# Patient Record
Sex: Male | Born: 1990 | Race: Black or African American | Hispanic: No | Marital: Single | Smoking: Current some day smoker
Health system: Southern US, Community
[De-identification: ages and names within clinical notes are randomized; demographics above are authoritative.]

## PROBLEM LIST (undated history)

## (undated) DIAGNOSIS — T7840XA Allergy, unspecified, initial encounter: Secondary | ICD-10-CM

## (undated) DIAGNOSIS — R809 Proteinuria, unspecified: Secondary | ICD-10-CM

## (undated) HISTORY — PX: NO PAST SURGERIES: SHX2092

## (undated) HISTORY — DX: Allergy, unspecified, initial encounter: T78.40XA

## (undated) HISTORY — DX: Proteinuria, unspecified: R80.9

---
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
## 2013-09-02 ENCOUNTER — Encounter (HOSPITAL_COMMUNITY): Payer: Self-pay | Admitting: Emergency Medicine

## 2013-09-02 ENCOUNTER — Emergency Department (HOSPITAL_COMMUNITY)
Admission: EM | Admit: 2013-09-02 | Discharge: 2013-09-02 | Disposition: A | Discharge status: Home / Self Care | Payer: BC Managed Care – PPO | Attending: Emergency Medicine | Admitting: Emergency Medicine

## 2013-09-02 ENCOUNTER — Emergency Department (HOSPITAL_COMMUNITY): Payer: BC Managed Care – PPO

## 2013-09-02 DIAGNOSIS — S199XXA Unspecified injury of neck, initial encounter: Secondary | ICD-10-CM

## 2013-09-02 DIAGNOSIS — S6990XA Unspecified injury of unspecified wrist, hand and finger(s), initial encounter: Secondary | ICD-10-CM | POA: Insufficient documentation

## 2013-09-02 DIAGNOSIS — S298XXA Other specified injuries of thorax, initial encounter: Secondary | ICD-10-CM | POA: Insufficient documentation

## 2013-09-02 DIAGNOSIS — S46909A Unspecified injury of unspecified muscle, fascia and tendon at shoulder and upper arm level, unspecified arm, initial encounter: Secondary | ICD-10-CM | POA: Insufficient documentation

## 2013-09-02 DIAGNOSIS — Y92410 Unspecified street and highway as the place of occurrence of the external cause: Secondary | ICD-10-CM | POA: Insufficient documentation

## 2013-09-02 DIAGNOSIS — Y9389 Activity, other specified: Secondary | ICD-10-CM | POA: Insufficient documentation

## 2013-09-02 DIAGNOSIS — S0993XA Unspecified injury of face, initial encounter: Secondary | ICD-10-CM | POA: Insufficient documentation

## 2013-09-02 DIAGNOSIS — S4980XA Other specified injuries of shoulder and upper arm, unspecified arm, initial encounter: Secondary | ICD-10-CM | POA: Insufficient documentation

## 2013-09-02 DIAGNOSIS — S59909A Unspecified injury of unspecified elbow, initial encounter: Secondary | ICD-10-CM | POA: Insufficient documentation

## 2013-09-02 DIAGNOSIS — S59919A Unspecified injury of unspecified forearm, initial encounter: Secondary | ICD-10-CM
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
## 2013-09-02 NOTE — ED Notes (Signed)
Pt received discharge instructions, verbalized understanding and has no further questions. Pt ambulated to exit in stable condition accompanied by parents.  Advised to return to emergency department with new or worsening symptoms.
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
## 2014-09-05 ENCOUNTER — Emergency Department (HOSPITAL_COMMUNITY)
Admission: EM | Admit: 2014-09-05 | Discharge: 2014-09-07 | Disposition: A | Discharge status: Other Acute Inpatient Hospital | Payer: BLUE CROSS/BLUE SHIELD | Attending: Emergency Medicine | Admitting: Emergency Medicine

## 2014-09-05 ENCOUNTER — Emergency Department (HOSPITAL_COMMUNITY): Payer: BLUE CROSS/BLUE SHIELD

## 2014-09-05 ENCOUNTER — Encounter (HOSPITAL_COMMUNITY): Payer: Self-pay | Admitting: *Deleted

## 2014-09-05 DIAGNOSIS — F919 Conduct disorder, unspecified: Secondary | ICD-10-CM | POA: Diagnosis not present

## 2014-09-05 DIAGNOSIS — R4182 Altered mental status, unspecified: Secondary | ICD-10-CM | POA: Diagnosis present

## 2014-09-05 DIAGNOSIS — Z79899 Other long term (current) drug therapy: Secondary | ICD-10-CM | POA: Insufficient documentation

## 2014-09-05 DIAGNOSIS — F1210 Cannabis abuse, uncomplicated: Secondary | ICD-10-CM | POA: Diagnosis not present

## 2014-09-05 LAB — COMPREHENSIVE METABOLIC PANEL
ALT: 19 U/L (ref 0–53)
AST: 24 U/L (ref 0–37)
Albumin: 4.2 g/dL (ref 3.5–5.2)
Alkaline Phosphatase: 53 U/L (ref 39–117)
Anion gap: 9 (ref 5–15)
BUN: 16 mg/dL (ref 6–23)
CO2: 25 mmol/L (ref 19–32)
Calcium: 9.4 mg/dL (ref 8.4–10.5)
Chloride: 101 mmol/L (ref 96–112)
Creatinine, Ser: 0.74 mg/dL (ref 0.50–1.35)
GFR calc Af Amer: >90 mL/min (ref >90)
GFR calc non Af Amer: >90 mL/min (ref >90)
Glucose, Bld: 88 mg/dL (ref 70–99)
Potassium: 3.6 mmol/L (ref 3.5–5.1)
Sodium: 135 mmol/L (ref 135–145)
Total Bilirubin: 1.9 mg/dL — ABNORMAL HIGH (ref 0.3–1.2)
Total Protein: 7.3 g/dL (ref 6.0–8.3)

## 2014-09-05 LAB — URINALYSIS, ROUTINE W REFLEX MICROSCOPIC
Bilirubin Urine: NEGATIVE
Glucose, UA: NEGATIVE mg/dL
Hgb urine dipstick: NEGATIVE
Ketones, ur: NEGATIVE mg/dL
Leukocytes, UA: NEGATIVE
Nitrite: NEGATIVE
Protein, ur: NEGATIVE mg/dL
Specific Gravity, Urine: 1.015 (ref 1.005–1.030)
Urobilinogen, UA: 0.2 mg/dL (ref 0.0–1.0)
pH: 5.5 (ref 5.0–8.0)

## 2014-09-05 LAB — CBC WITH DIFFERENTIAL/PLATELET
Basophils Absolute: 0 10*3/uL (ref 0.0–0.1)
Basophils Relative: 0 % (ref 0–1)
Eosinophils Absolute: 0.1 10*3/uL (ref 0.0–0.7)
Eosinophils Relative: 1 % (ref 0–5)
HCT: 42 % (ref 39.0–52.0)
Hemoglobin: 14.1 g/dL (ref 13.0–17.0)
Lymphocytes Relative: 24 % (ref 12–46)
Lymphs Abs: 1.8 10*3/uL (ref 0.7–4.0)
MCH: 28.1 pg (ref 26.0–34.0)
MCHC: 33.6 g/dL (ref 30.0–36.0)
MCV: 83.7 fL (ref 78.0–100.0)
Monocytes Absolute: 0.6 10*3/uL (ref 0.1–1.0)
Monocytes Relative: 8 % (ref 3–12)
Neutro Abs: 5.3 10*3/uL (ref 1.7–7.7)
Neutrophils Relative %: 67 % (ref 43–77)
Platelets: 240 10*3/uL (ref 150–400)
RBC: 5.02 MIL/uL (ref 4.22–5.81)
RDW: 14.4 % (ref 11.5–15.5)
WBC: 7.8 10*3/uL (ref 4.0–10.5)

## 2014-09-05 LAB — RAPID URINE DRUG SCREEN, HOSP PERFORMED
Amphetamines: NOT DETECTED
Barbiturates: NOT DETECTED
Benzodiazepines: NOT DETECTED
Cocaine: NOT DETECTED
Opiates: NOT DETECTED
Tetrahydrocannabinol: POSITIVE — AB

## 2014-09-05 LAB — CBG MONITORING, ED: GLUCOSE-CAPILLARY: 101 mg/dL — AB (ref 70–99)
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
## 2014-09-05 NOTE — ED Notes (Signed)
Pt oriented x3. Pt able to give urine sample. No acute distress noted.
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
## 2014-09-05 NOTE — ED Notes (Signed)
TTS completed. 

## 2014-09-05 NOTE — BH Assessment (Signed)
Received call for TTS consult. Spoke to Dr. Raeford RazorStephen Kohut who said Pt was found wandering aimlessly. Pt appears to be behaving bizarrely and appears to be responding to internal stimuli. Tele-assessment will be initiated.  Harlin RainFord Ellis Ria CommentWarrick Jr, LPC, [facility] Triage Specialist [phone]

## 2014-09-06 MED ORDER — OLANZAPINE 5 MG PO TBDP
5.0000 mg | ORAL_TABLET | ORAL | Status: AC
  Administered 2014-09-06: 5 mg via ORAL
  Filled 2014-09-06: qty 1

## 2014-09-06 NOTE — ED Notes (Signed)
Family in formed of the inpatient recommendation & that we are now waiting for placement.

## 2014-09-06 NOTE — ED Notes (Signed)
Tele psych machine at bedside 

## 2014-09-06 NOTE — ED Notes (Signed)
[facility] and counselor stated they would [facility] the [facility] and give me a call back.

## 2014-09-06 NOTE — ED Notes (Signed)
[facility] with [facility].   She stated, "[facility] at [facility], and will refer to other places".  Informed Lurena JoinerRebecca of the call.

## 2014-09-06 NOTE — Progress Notes (Signed)
Pt referral faxed to the following facilities who are accepting referrals or report bed availability:  [facility], [facility], [facility], [facility], [facility]  Will continue to seek placement.  Chad CordialLauren Carter, LCSWA 09/06/2014 2:17 PM

## 2014-09-06 NOTE — ED Provider Notes (Signed)
7:35 AM Father reports pt is doing better. Slept last night. [facility]  Azalia BilisKevin Myra Weng, MD 09/06/14 [phone]

## 2014-09-06 NOTE — ED Notes (Signed)
Pt ambulated to restroom & returned to room w/ no complications. 

## 2014-09-06 NOTE — ED Notes (Signed)
Pt undergoing TTS consult.  

## 2014-09-06 NOTE — BH Assessment (Signed)
[facility] Areola(Eric) [facility].  TTS [facility](Samatha) will follow up with the referrals to [facility], [facility], [facility], [facility] and [facility]. Writer was not able to speak to the nurse Lurena Joiner(Rebecca) because she was with another patient, per unit Diplomatic Services operational officersecretary.

## 2014-09-06 NOTE — BHH Counselor (Signed)
Pt was recommended for inpatient treatment by Nani SkillernJohn Conrad Withrow, NP. Binnie RailJoann Glover, [facility], confirmed a bed has become available. Notified Hulan FessIjeoma Nwaeze, NP who accepted Pt to the service of Dr. Elvera MariaS. Eappen, room [id number]. Notified Rolland PorterMark James, MD at [facility] of acceptance.  Harlin RainFord Ellis Patsy BaltimoreWarrick Jr, LPC, [facility], [facility] Triage Specialist [phone]
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
## 2014-09-07 ENCOUNTER — Encounter (HOSPITAL_COMMUNITY): Payer: Self-pay

## 2014-09-07 ENCOUNTER — Inpatient Hospital Stay (HOSPITAL_COMMUNITY)
Admission: AD | Admit: 2014-09-07 | Discharge: 2014-09-10 | DRG: 897 | Disposition: A | Discharge status: Home / Self Care | Payer: BLUE CROSS/BLUE SHIELD | Source: Intra-hospital | Attending: Psychiatry | Admitting: Psychiatry

## 2014-09-07 DIAGNOSIS — F1220 Cannabis dependence, uncomplicated: Secondary | ICD-10-CM | POA: Diagnosis not present

## 2014-09-07 DIAGNOSIS — F12221 Cannabis dependence with intoxication delirium: Principal | ICD-10-CM

## 2014-09-07 DIAGNOSIS — F29 Unspecified psychosis not due to a substance or known physiological condition: Secondary | ICD-10-CM | POA: Diagnosis not present

## 2014-09-07 DIAGNOSIS — F17210 Nicotine dependence, cigarettes, uncomplicated: Secondary | ICD-10-CM | POA: Diagnosis present

## 2014-09-07 DIAGNOSIS — F440 Dissociative amnesia: Secondary | ICD-10-CM

## 2014-09-07 DIAGNOSIS — F8081 Childhood onset fluency disorder: Secondary | ICD-10-CM | POA: Diagnosis present

## 2014-09-07 DIAGNOSIS — F12121 Cannabis abuse with intoxication delirium: Secondary | ICD-10-CM | POA: Diagnosis not present

## 2014-09-07 LAB — TSH
TSH: 1.579 u[IU]/mL (ref 0.350–4.500)
TSH: 1.484 u[IU]/mL (ref 0.350–4.500)

## 2014-09-07 MED ORDER — OLANZAPINE 5 MG PO TBDP
5.0000 mg | ORAL_TABLET | Freq: Every day | ORAL | Status: DC
  Administered 2014-09-07 – 2014-09-08 (×2): 5 mg via ORAL
  Filled 2014-09-07 (×6): qty 1

## 2014-09-07 MED ORDER — ACETAMINOPHEN 325 MG PO TABS
650.0000 mg | ORAL_TABLET | Freq: Once | ORAL | Status: AC
  Administered 2014-09-07: 650 mg via ORAL
  Filled 2014-09-07: qty 2

## 2014-09-07 MED ORDER — MAGNESIUM HYDROXIDE 400 MG/5ML PO SUSP: 30.0000 mL | Freq: Every day | ORAL | Status: DC | PRN

## 2014-09-07 MED ORDER — ACETAMINOPHEN 325 MG PO TABS: 650.0000 mg | ORAL_TABLET | Freq: Four times a day (QID) | ORAL | Status: DC | PRN

## 2014-09-07 MED ORDER — ALUM & MAG HYDROXIDE-SIMETH 200-200-20 MG/5ML PO SUSP: 30.0000 mL | ORAL | Status: DC | PRN

## 2014-09-07 MED ORDER — OLANZAPINE 5 MG PO TBDP
5.0000 mg | ORAL_TABLET | Freq: Every day | ORAL | Status: DC
  Administered 2014-09-07 – 2014-09-09 (×3): 5 mg via ORAL
  Filled 2014-09-07 (×7): qty 1
  Filled 2014-09-07: qty 14

## 2014-09-07 MED ORDER — GLUCOSE 40 % PO GEL: 1.0000 | Freq: Once | ORAL | Status: AC | PRN
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
## 2014-09-07 NOTE — Progress Notes (Signed)
Patient ID: Carlos ManisDevante R Dapper, male   DOB: 01/11/1991, 24 y.o.   MRN: [id number]   D: Pt has been very psychotic on the unit today. Pt appeared to be responding to internal stimuli, at times patient would just sit in the hall look at the ceiling. At times patient would follow other patients for no reason, and would require redirection. Pt is easily redirected. Shavon NP made aware, new orders noted. Pt reported being negative SI/HI, no AH/VH noted. A: 15 min checks continued for patient safety. R: Pt safety maintained.

## 2014-09-07 NOTE — [facility] Group Notes (Signed)
[facility] Group Notes:  (Nursing/MHT/Case Management/Adjunct)  Date:  09/07/2014  Time:  3:01 PM  Type of Therapy:  Psychoeducational Skills  Participation Level:  Did Not Attend  Participation Quality:  Did Not Attend  Affect:  Did Not Attend  Cognitive:  Did Not Attend  Insight:  None  Engagement in Group:  Did Not Attend  Modes of Intervention:  Did Not Attend  Summary of Progress/Problems: Pt did not attend healthy support systems group.    Jacquelyne BalintForrest, Aarian Griffie Shanta 09/07/2014, 3:01 PM

## 2014-09-07 NOTE — Progress Notes (Signed)
D:Patient has been bed tonight.  Patient states he is tired.  Patient would not voice his goal fr today with Clinical research associatewriter or what he has learned since he has been here.  Patient denies SI/HI and denies AVH although he appears to be responding to internal stimuli.  Patient not visible on the unit tonight  A: Staff to monitor Q 15 mins for safety.  Encouragement and support offered.  Scheduled medications administered per orders. R: Patient remains safe on the unit.  Patient did not attend group tonight.  Patient not visible on the unit.  Patient taking scheduled medications.
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
## 2014-09-07 NOTE — Progress Notes (Signed)
Pt admitted to unit. Pt alert and awake in bed. Introduced to patient. Will monitor closely.

## 2014-09-07 NOTE — [facility] Group Notes (Signed)
[facility] Group Notes: (Clinical Social Work)   09/07/2014      Type of Therapy:  Group Therapy   Participation Level:  Did Not Attend despite MHT prompting   Annaclaire Walsworth Grossman-Orr, LCSW 09/07/2014, 12:41 PM     

## 2014-09-07 NOTE — [facility] Group Notes (Signed)
[facility] Group Notes:  (Nursing/MHT/Case Management/Adjunct)  Date:  09/07/2014  Time:  2:58 PM  Type of Therapy:  Psychoeducational Skills  Participation Level:  Did Not Attend  Participation Quality:  Did Not Attend  Affect:  Did Not Attend  Cognitive:  Did Not Attend  Insight:  None  Engagement in Group:  Did Not Attend  Modes of Intervention:  Did Not Attend  Summary of Progress/Problems: Pt did not attend patient self inventory group.    Jacquelyne BalintForrest, Rosely Fernandez Shanta 09/07/2014, 2:58 PM

## 2014-09-08 DIAGNOSIS — F440 Dissociative amnesia: Secondary | ICD-10-CM

## 2014-09-08 DIAGNOSIS — F1220 Cannabis dependence, uncomplicated: Secondary | ICD-10-CM | POA: Diagnosis present

## 2014-09-08 DIAGNOSIS — F12121 Cannabis abuse with intoxication delirium: Secondary | ICD-10-CM

## 2014-09-08 DIAGNOSIS — F8081 Childhood onset fluency disorder: Secondary | ICD-10-CM | POA: Diagnosis present

## 2014-09-08 LAB — LIPID PANEL
CHOL/HDL RATIO: 2.5 ratio
CHOLESTEROL: 133 mg/dL (ref 0–200)
HDL: 54 mg/dL (ref >39)
LDL Cholesterol: 65 mg/dL (ref 0–99)
Triglycerides: 71 mg/dL (ref <150)
VLDL: 14 mg/dL (ref 0–40)

## 2014-09-08 NOTE — [facility] Suicide Risk Assessment (Signed)
[facility] INPATIENT:  Family/Significant Other Suicide Prevention Education  Suicide Prevention Education:  Education Completed; Gloriajean DellSharon Robinson, mother,has been identified by the patient as the family member/significant other with whom the patient will be residing, and identified as the person(s) who will aid the patient in the event of a mental health crisis (suicidal ideations/suicide attempt).  With written consent from the patient, the family member/significant other has been provided the following suicide prevention education, prior to the and/or following the discharge of the patient.  The suicide prevention education provided includes the following:  Suicide risk factors  Suicide prevention and interventions  National Suicide Hotline telephone number  [facility] assessment telephone number  [facility] Emergency Assistance 911  [facility] and/or Residential Mobile Crisis Unit telephone number  Request made of family/significant other to:  Remove weapons (e.g., guns, rifles, knives), all items previously/currently identified as safety concern.    Remove drugs/medications (over-the-counter, prescriptions, illicit drugs), all items previously/currently identified as a safety concern.  The family member/significant other verbalizes understanding of the suicide prevention education information provided.  The family member/significant other agrees to remove the items of safety concern listed above.  Mother states there are no firearms in the home and medications are secured.  Patient will be discharging to parents home and will not return to his house per mother.    Santa GeneraAnne Kyreese Chio, LCSW Clinical Social Worker   Sallee LangeCunningham, Randy Whitener C 09/08/2014, 3:51 PM

## 2014-09-08 NOTE — Progress Notes (Signed)
Nutrition Brief Note  Patient identified on the Malnutrition Screening Tool (MST) Report   Pt refused to talk to RD, states his name is not Noor. Nutrition assessment not appropriate for patient at this time.  Wt Readings from Last 15 Encounters:  09/07/14 132 lb (59.875 kg)  09/02/13 143 lb (64.864 kg)    Body mass index is 20.67 kg/(m^2). Patient meets criteria for normal range based on current BMI.   Diet Order: Diet regular Room service appropriate?: Yes; Fluid consistency:: Thin Pt is also offered choice of unit snacks mid-morning and mid-afternoon.  Pt is eating as desired.  Labs and medications reviewed.   No nutrition interventions warranted at this time. If nutrition issues arise, please consult RD.   Tilda FrancoLindsey Ahriana Gunkel, MS, RD, LDN Pager: [phone] After Hours Pager: [phone]

## 2014-09-08 NOTE — Plan of Care (Signed)
Problem: Ineffective individual coping Goal: STG: Patient will remain free from self harm Outcome: Completed/Met Date Met:  09/08/14 Patient denies suicidal ideation.     
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
## 2014-09-08 NOTE — [facility] Group Notes (Signed)
[facility] LCSW Group Therapy  09/08/2014 1:15 pm  Type of Therapy: Process Group Therapy  Participation Level:  Active  Participation Quality:  Appropriate  Affect:  Flat  Cognitive:  Oriented  Insight:  Improving  Engagement in Group:  Limited  Engagement in Therapy:  Limited  Modes of Intervention:  Activity, Clarification, Education, Problem-solving and Support  Summary of Progress/Problems: Today's group addressed the issue of overcoming obstacles.  Patients were asked to identify their biggest obstacle post d/c that stands in the way of their on-going success, and then problem solve as to how to manage this.  Sat quietly throughout.  When asked directly.  Stated he does not think he has any obstacles.  Carlos Zimmerman, Carlos Zimmerman 09/08/2014   4:00 PM
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
## 2014-09-08 NOTE — Progress Notes (Addendum)
D: Patient has minimal interaction with staff.  He is compliant with his medication.  He is observed in the hallway pacing and staring off into space.  He appears to be responding to internal stimuli.  He has also been observed in his room sitting quietly and staring at the walls.  He denies SI/HI.  Patient denies any depressive symptoms, hopelessness or anxiety.  He did not list a goal for today. A: Continue to monitor medication management and MD orders.  Safety checks completed every 15 minutes per protocol.  Meet 1:1 with patient to discuss concerns and offer encouragement.  R: Patient is redirectable.

## 2014-09-08 NOTE — Progress Notes (Signed)
D: Patient in his room in bed on approach.  Patient states he had an ok day.  Patient states he has been trying t be more visible on the unit and he states he has been talking to peers.  Patient was oriented x4.  Patient states his family came to visit today.  Patient denies SI/HI and denies AVH. A: Staff to monitor Q 15 mins for safety.  Encouragement and support offered.  Scheduled medications administered per orders. R: Patient remains safe on the unit.  Patient attended group tonight. Patient visible on the unit.  Patient taking administered medications.

## 2014-09-08 NOTE — Progress Notes (Signed)
   Patient does not have capacity to participate in disposition planning at this time.  Jomarie LongsSaramma Oniya Mandarino ,MD Attending Psychiatrist  [facility] 09/08/14 12:15 pm

## 2014-09-08 NOTE — BHH Group Notes (Signed)
[facility]   09/08/2014 3:53 PM  Participation Quality:  Minimal  Mood/Affect:  Blunted  Depression Rating:    Anxiety Rating:    Thoughts of Suicide:  No Will you contract for safety?   NA  Current AVH:  "I don't know"  Plan for Discharge/Comments:  Stated it was his idea to come to hospital, and could tell me what he had for breakfast.  Every other question he responded to by stating "I don't know."  Transportation Means:   Supports:  Kiribatiorth, Thereasa Distanceodney B

## 2014-09-09 DIAGNOSIS — F12221 Cannabis dependence with intoxication delirium: Secondary | ICD-10-CM

## 2014-09-09 LAB — HEPATITIS PANEL, ACUTE
HCV Ab: NEGATIVE
HEP A IGM: NONREACTIVE
HEP B C IGM: NONREACTIVE
Hepatitis B Surface Ag: NEGATIVE

## 2014-09-09 LAB — HEMOGLOBIN A1C
HEMOGLOBIN A1C: 5.8 % — AB (ref 4.8–5.6)
MEAN PLASMA GLUCOSE: 120 mg/dL

## 2014-09-09 NOTE — Progress Notes (Signed)
Adult Psychoeducational Group Note  Date:  09/09/2014 Time:  0900 Group Topic/Focus: Nurses group   Recovery Goals:   The focus of this group is to identify appropriate goals for recovery and establish a plan to achieve them.  Participation Level:  Active  Participation Quality:  Appropriate  Affect:  Appropriate  Cognitive:  Appropriate  Insight: Appropriate  Engagement in Group:  Engaged  Modes of Intervention:  Discussion  Additional Comments:    Medardo Hassing L 09/09/2014, 3:55 PM

## 2014-09-09 NOTE — Progress Notes (Signed)
BHH Assessment Progress Note     Patient attended wrap up group and says that he had a good day. Patient says that he wants to stop smoking. The positive thing that has happened for him today was that he found out that he was a father to a baby girl.

## 2014-09-09 NOTE — BHH Group Notes (Signed)
BHH Group Notes:  (Counselor/Nursing/MHT/Case Management/Adjunct)  09/09/2014 1:15PM  Type of Therapy:  Group Therapy  Participation Level:  Active  Participation Quality:  Appropriate  Affect:  Flat  Cognitive:  Oriented  Insight:  Improving  Engagement in Group:  Limited  Engagement in Therapy:  Limited  Modes of Intervention:  Discussion, Exploration and Socialization  Summary of Progress/Problems: The topic for group was balance in life.  Pt participated in the discussion about when their life was in balance and out of balance and how this feels.  Pt discussed ways to get back in balance and short term goals they can work on to get where they want to be. Sat quietly through group.  Responded approriately when asked directly.  Stated that he has a great support system, and named multiple nuclear and extended family members.  Also shared that using positive self talk is how he finds the perseverence to kepp moving forward through tough times.   Ida Rogueorth, Kaleiyah Polsky B 09/09/2014 12:35 PM
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
## 2014-09-09 NOTE — Progress Notes (Signed)
D: Pt presents with flat affect on approach. During initial assessment pt was confused to city, month and location of his parents. Pt denies any suicidal/homicidal thoughts. Pt denies auditory/visual hallucinations. Pt responded with vague answers when answering questions and replied with "I don't know to majority of the questions. Pt have little engagement on the unit. Pt denies feeling paranoid. Pt reported that he slept well last night.  A: Medications administered as ordered per MD. Verbal support given. Pt encouraged to attend groups. 15 minute checks performed for safety.  R: Pt safety maintained at this time.

## 2014-09-09 NOTE — Progress Notes (Signed)
D:Patient in his room on approach.  Patient more clear today a less confused.  Patient engaging in conversation with Clinical research associatewriter.  Patient oriented to person, place and day.    Patient states he feels much better and states he is ready to go home.  Patient states his mind is much more clear but he acknowledges he is still having trouble getting out some  of his words out but he is logical and coherent.  Patient denies SI/HI and denies AVH. A: Staff to monitor Q 15 mins for safety.  Encouragement and support offered.  Scheduled medications administered per orders.   R: Patient remains safe on the unit.  Patient attended group tonight.  Patient visible on the unit and interacting with peers.  Patient taking administered medications
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
## 2014-09-10 ENCOUNTER — Encounter (HOSPITAL_COMMUNITY): Payer: Self-pay | Admitting: Registered Nurse

## 2014-09-10 MED ORDER — OLANZAPINE 5 MG PO TBDP: 5.0000 mg | ORAL_TABLET | Freq: Every day | ORAL | Status: DC

## 2014-09-10 NOTE — Progress Notes (Signed)
D: Pt brightens upon approach. Eye contact is fair. He is alert and oriented. He states that he slept well and rates his depression 0/10.  A: Support given. Verbalization encouraged. Pt encouraged to come to staff for any concerns. Medications given as prescribed. R: Pt is receptive. No complaints of pain or discomfort at this time. Q15 min safety checks maintained. Pt remains safe on the unit. Will continue to monitor.

## 2014-09-10 NOTE — Progress Notes (Signed)
Pt discharged to home with family. Reviewed pt's discharge instructions with patient and parents with the patient's consent. Pt and family verbalized understanding. Pt's belongings returned. Pt was stable upon discharge.
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
## 2014-09-10 NOTE — [facility] Suicide Risk Assessment (Signed)
[facility] Discharge [facility]icide Risk Assessment   Demographic Factors:  Male  Total Time spent with patient: 30 minutes  Musculoskeletal: Strength & Muscle Tone: within normal limits Gait & Station: normal Patient leans: N/A  Psychiatric Specialty Exam: Physical Exam  Review of Systems  Psychiatric/Behavioral: Positive for substance abuse. Negative for depression, suicidal ideas and hallucinations. The patient is not nervous/anxious and does not have insomnia.     Blood pressure 128/86, pulse 73, temperature 98 F (36.7 C), temperature source Oral, resp. rate 16, height 5\' 7"  (1.702 m), weight 59.875 kg (132 lb).Body mass index is 20.67 kg/(m^2).  General Appearance: Fairly Groomed  Patent attorneyye Contact::  Fair  Speech:  Clear and Coherent409  Volume:  Normal  Mood:  Euthymic  Affect:  Congruent  Thought Process:  Coherent  Orientation:  Full (Time, Place, and Person)  Thought Content:  WDL  Suicidal Thoughts:  No  Homicidal Thoughts:  No  Memory:  Immediate;   Fair Recent;   Fair Remote;   Fair  Judgement:  Fair  [facility]:  Fair  Psychomotor Activity:  Normal  Concentration:  Fair  Recall:  FiservFair  Fund of Knowledge:Fair  Language: Fair  Akathisia:  No  Handed:  Right  AIMS (if indicated):     Assets:  Communication Skills Desire for Improvement  Sleep:  Number of Hours: 5  Cognition: WNL  ADL's:  Intact   Have you used any form of tobacco in the last 30 days? (Cigarettes, Smokeless Tobacco, Cigars, and/or Pipes): Yes  Has this patient used any form of tobacco in the last 30 days? (Cigarettes, Smokeless Tobacco, Cigars, and/or Pipes) No  Mental Status Per Nursing Assessment::   On Admission:  NA  Current Mental Status by Physician: pt denies SI/HI/AH/VH, PT IS ALERT, ORIENTED X3  Loss Factors: NA  Historical Factors: Impulsivity  Risk Reduction Factors:   Employed, Living with another person, especially a relative and Positive social support  Continued Clinical  Symptoms:  Alcohol/Substance Abuse/Dependencies  Cognitive Features That Contribute To Risk:  None    Suicide Risk:  Minimal: No identifiable suicidal ideation.  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 Cannabis use disorder, moderate, dependence [F12.20] 09/08/2014  . Stuttering [F80.81] 09/08/2014    Follow-up Information    Follow up with [facility] On 09/12/2014.   Why:  Go to the walk-in clinic on Friday between 8 and 10:30 for your hospitalization follow up appointment   Contact information:   [address]  [place]  [phone]      Plan Of Care/Follow-up recommendations:  Activity:  NO RESTRICTIONS Diet:  REGULAR Tests:  AS NEEDED Other:  FOLLOW UP WITH AFTER CARE  Is patient on multiple antipsychotic therapies at discharge:  No   Has Patient had three or more failed trials of antipsychotic monotherapy by history:  No  Recommended Plan for Multiple Antipsychotic Therapies: NA    Mella Inclan MD 09/10/2014, 9:33 AM

## 2014-09-10 NOTE — Progress Notes (Signed)
  [facility] Adult [facility] Discharge Plan :  Will you be returning to the same living situation after discharge:  Yes,  home At discharge, do you have transportation home?: Yes,  family Do you have the ability to pay for your medications: Yes,  insurance  Release of information consent forms completed and in the chart;  Patient's signature needed at discharge.  Patient to Follow up at: Follow-up Information    Follow up with [facility] On 10/17/2014.   Why:  With Dr Tenny Crawoss on Friday.  Arrive at 9:45 for a 10:30 appointment.  There is paperwork that you will need to complete  This is a psychiatrist to see you about your medication   Contact information:   [address]  [place]  [phone]      Follow up with [facility].   Why:  Call Mr Fran LowesHolder for a substance abuse assessment   Contact information:   [address]  [place]  [phone]      Patient denies SI/HI: Yes,  yes    Safety Planning and Suicide Prevention discussed: Yes,  yes  Have you used any form of tobacco in the last 30 days? (Cigarettes, Smokeless Tobacco, Cigars, and/or Pipes): Yes  Has patient been referred to the Quitline?: N/A patient is not a smoker, despite what the above indicates.  Daryel Geraldorth, Takasha Vetere B 09/10/2014, 11:07 AM

## 2014-09-10 NOTE — Tx Team (Signed)
  Interdisciplinary Treatment Plan Update   Date Reviewed:  09/10/2014  Time Reviewed:  10:09 AM  Progress in Treatment:   Attending groups: Yes Participating in groups: Yes Taking medication as prescribed: Yes  Tolerating medication: Yes Family/Significant other contact made: Yes  Patient understands diagnosis: Yes  Discussing patient identified problems/goals with staff: Yes  See initial care plan Medical problems stabilized or resolved: Yes Denies suicidal/homicidal ideation: Yes  In tx team Patient has not harmed self or others: Yes  For review of initial/current patient goals, please see plan of care.  Estimated Length of Stay:  D/C today  Reason for Continuation of Hospitalization:   New Problems/Goals identified:  N/A  Discharge Plan or Barriers:   return home, follow up outpt  Additional Comments:  Attendees:  Signature: Ivin BootySarama Eappen, MD 09/10/2014 10:09 AM   Signature: Richelle Itood Kaydince Towles, LCSW 09/10/2014 10:09 AM  Signature:  09/10/2014 10:09 AM  Signature: Marzetta Boardhrista Dopson, RN 09/10/2014 10:09 AM  Signature:  09/10/2014 10:09 AM  Signature:  09/10/2014 10:09 AM  Signature:   09/10/2014 10:09 AM  Signature:    Signature:    Signature:    Signature:    Signature:    Signature:      Scribe for Treatment Team:   [employer] Maridel Pixler, LCSW  09/10/2014 10:09 AM

## 2014-09-15 NOTE — Progress Notes (Signed)
Patient Discharge Instructions:  After Visit Summary (AVS):   Faxed to:  09/15/14 Discharge Summary Note:   Faxed to:  09/15/14 Psychiatric Admission Assessment Note:   Faxed to:  09/15/14 Suicide Risk Assessment - Discharge Assessment:   Faxed to:  09/15/14 Faxed/Sent to the Next Level Care provider:  09/15/14 Next Level Care Provider Has Access to the EMR, 09/15/14  Faxed to [facility] @ [phone] [facility] to [facility] via [facility]/Epic access. Jerelene ReddenSheena E Ariton, 09/15/2014, 2:04 PM

## 2014-10-17 ENCOUNTER — Ambulatory Visit (INDEPENDENT_AMBULATORY_CARE_PROVIDER_SITE_OTHER): Payer: BLUE CROSS/BLUE SHIELD | Admitting: Psychiatry

## 2014-10-17 ENCOUNTER — Encounter (HOSPITAL_COMMUNITY): Payer: Self-pay | Admitting: Psychiatry

## 2014-10-17 VITALS — BP 127/74 | HR 85 | Ht 67.0 in | Wt 147.6 lb

## 2014-10-17 DIAGNOSIS — F12921 Cannabis use, unspecified with intoxication delirium: Secondary | ICD-10-CM | POA: Diagnosis not present
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
## 2016-10-13 ENCOUNTER — Encounter (HOSPITAL_COMMUNITY): Payer: Self-pay

## 2016-10-13 ENCOUNTER — Emergency Department (HOSPITAL_COMMUNITY)
Admission: EM | Admit: 2016-10-13 | Discharge: 2016-10-13 | Disposition: A | Discharge status: Home / Self Care | Payer: BLUE CROSS/BLUE SHIELD | Attending: Emergency Medicine | Admitting: Emergency Medicine

## 2016-10-13 DIAGNOSIS — R05 Cough: Secondary | ICD-10-CM | POA: Diagnosis present

## 2016-10-13 DIAGNOSIS — F17210 Nicotine dependence, cigarettes, uncomplicated: Secondary | ICD-10-CM | POA: Insufficient documentation

## 2016-10-13 DIAGNOSIS — J209 Acute bronchitis, unspecified: Secondary | ICD-10-CM | POA: Insufficient documentation

## 2016-10-13 MED ORDER — ALBUTEROL SULFATE (2.5 MG/3ML) 0.083% IN NEBU
2.5000 mg | INHALATION_SOLUTION | Freq: Once | RESPIRATORY_TRACT | Status: AC
  Administered 2016-10-13: 2.5 mg via RESPIRATORY_TRACT
  Filled 2016-10-13: qty 3

## 2016-10-13 MED ORDER — PREDNISONE 20 MG PO TABS: ORAL_TABLET | ORAL | 0 refills | Status: DC

## 2016-10-13 MED ORDER — AZITHROMYCIN 250 MG PO TABS: ORAL_TABLET | ORAL | 0 refills | Status: DC

## 2016-10-13 MED ORDER — ALBUTEROL SULFATE (2.5 MG/3ML) 0.083% IN NEBU: 5.0000 mg | INHALATION_SOLUTION | Freq: Once | RESPIRATORY_TRACT | Status: DC

## 2016-10-13 MED ORDER — IPRATROPIUM BROMIDE 0.02 % IN SOLN: 0.5000 mg | Freq: Once | RESPIRATORY_TRACT | Status: DC

## 2016-10-13 MED ORDER — ALBUTEROL SULFATE HFA 108 (90 BASE) MCG/ACT IN AERS
2.0000 | INHALATION_SPRAY | Freq: Four times a day (QID) | RESPIRATORY_TRACT | Status: DC | PRN
  Administered 2016-10-13: 2 via RESPIRATORY_TRACT
  Filled 2016-10-13: qty 6.7

## 2016-10-13 MED ORDER — IPRATROPIUM-ALBUTEROL 0.5-2.5 (3) MG/3ML IN SOLN
3.0000 mL | Freq: Once | RESPIRATORY_TRACT | Status: AC
  Administered 2016-10-13: 3 mL via RESPIRATORY_TRACT
  Filled 2016-10-13: qty 3

## 2016-10-13 MED ORDER — AEROCHAMBER Z-STAT PLUS/MEDIUM MISC: 1.0000 | Freq: Once | Status: DC

## 2016-10-13 NOTE — Discharge Instructions (Signed)
Drink plenty of fluids. You can take mucinex DM OTC for cough. Take the medications as prescribed. Use the inhaler with the spacer every 6 hrs as needed for wheezing or shortness of breath. TRY TO STOP SMOKING!!! Recheck if you get worse again.

## 2016-10-13 NOTE — ED Triage Notes (Signed)
Pt reports dry cough since approx 2 am, states coughing since then until it feels like he will vomit.

## 2016-10-13 NOTE — ED Provider Notes (Signed)
[facility]Y DEPT Provider Note   CSN: [id number] Arrival date & time: 10/13/16  0608  Time seen 06:23 AM   History   Chief Complaint Chief Complaint  Patient presents with  . Cough    HPI Pier R Scipio is a 26 y.o. male.  HPI  patient reports he started having a cough at 2:30 PM on 5/23. He states sometimes he coughs up some white mucus, he also describes clear rhinorrhea without sneezing. He denies sore throat. He states when he breathes he hears a whistling noise. He denies any prior history of reactive airway disease. He states he's never used an inhaler in the past. He also states he is coughing so hard he is having posttussive vomiting. Patient is trying to quit smoking black and mild cigars. He is unaware of being around anybody else is ill.  PCP none  Past Medical History:  Diagnosis Date  . Medical history non-contributory     Patient Active Problem List   Diagnosis Date Noted  . 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 Cannabis use disorder, moderate, dependence (HCC) 09/08/2014  . Stuttering 09/08/2014    Past Surgical History:  Procedure Laterality Date  . NO PAST SURGERIES         Home Medications    Prior to Admission medications   Medication Sig Start Date End Date Taking? Authorizing Provider  azithromycin (ZITHROMAX) 250 MG tablet Take 2 po the first day then once a day for the next 4 days. 10/13/16   Devoria Albe, MD  predniSONE (DELTASONE) 20 MG tablet Take 3 po QD x 3d , then 2 po QD x 3d then 1 po QD x 3d 10/13/16   Devoria Albe, MD    Family History No family history on file.  Social History Social History  Substance Use Topics  . Smoking status: Current Some Day Smoker    Types: Cigarettes  . Smokeless tobacco: Never Used  . Alcohol use No     Comment: Per pt 2-3-mths to get a beer  employed Smokes 2 black and mild cigars a day   Allergies   Patient has no known  allergies.   Review of Systems Review of Systems  All other systems reviewed and are negative.    Physical Exam Updated Vital Signs BP (!) 130/91 (BP Location: Left Arm)   Pulse 99   Temp 98.3 F (36.8 C) (Oral)   Resp 17   Ht 5\' 8"  (1.727 m)   Wt 63.5 kg (140 lb)   SpO2 97%   BMI 21.29 kg/m   Vital signs normal    Physical Exam  Constitutional: He is oriented to person, place, and time. He appears well-developed and well-nourished.  Non-toxic appearance. He does not appear ill. No distress.  HENT:  Head: Normocephalic and atraumatic.  Right Ear: External ear normal.  Left Ear: External ear normal.  Nose: Nose normal. No mucosal edema or rhinorrhea.  Mouth/Throat: Oropharynx is clear and moist and mucous membranes are normal. No dental abscesses or uvula swelling.  Eyes: Conjunctivae and EOM are normal. Pupils are equal, round, and reactive to light.  Neck: Normal range of motion and full passive range of motion without pain. Neck supple.  Cardiovascular: Normal rate, regular rhythm and normal heart sounds.  Exam reveals no gallop and no friction rub.   No murmur heard. Pulmonary/Chest: Effort normal. No respiratory distress. He has decreased breath sounds. He has wheezes. He has no rhonchi.  He has no rales. He exhibits no tenderness and no crepitus.  Rare end expiratory wheezing especially in the left posterior lower lungs.  Pt noted to have hard coughing spasms intermittently.  Abdominal: Soft. Normal appearance and bowel sounds are normal. He exhibits no distension. There is no tenderness. There is no rebound and no guarding.  Musculoskeletal: Normal range of motion. He exhibits no edema or tenderness.  Moves all extremities well.   Neurological: He is alert and oriented to person, place, and time. He has normal strength. No cranial nerve deficit.  Skin: Skin is warm, dry and intact. No rash noted. No erythema. No pallor.  Psychiatric: He has a normal mood and affect.  His speech is normal and behavior is normal. His mood appears not anxious.  Nursing note and vitals reviewed.    ED Treatments / Results  Labs (all labs ordered are listed, but only abnormal results are displayed) Labs Reviewed - No data to display  EKG  EKG Interpretation None       Radiology No results found.  Procedures Procedures (including critical care time)  Medications Ordered in ED Medications  albuterol (PROVENTIL HFA;VENTOLIN HFA) 108 (90 Base) MCG/ACT inhaler 2 puff (not administered)  aerochamber Z-Stat Plus/medium 1 each (not administered)  ipratropium-albuterol (DUONEB) 0.5-2.5 (3) MG/3ML nebulizer solution 3 mL (3 mLs Nebulization Given 10/13/16 0650)  albuterol (PROVENTIL) (2.5 MG/3ML) 0.083% nebulizer solution 2.5 mg (2.5 mg Nebulization Given 10/13/16 0650)     Initial Impression / Assessment and Plan / ED Course  I have reviewed the triage vital signs and the nursing notes.  Pertinent labs & imaging results that were available during my care of the patient were reviewed by me and considered in my medical decision making (see chart for details).  Patient was given an albuterol and Atrovent nebulizer to see if that will help with his feeling that he is hearing whistling when he breathes and with the degree of coughing he is having.  Recheck at 07:00 patient is no longer having the hard coughing spasms. His lungs are clear and he has improved air movement. We discussed that he should continue to try to quit smoking, he was given an inhaler and respiratory therapy showed him how to use it with a spacer. Because he is a smoker he was discharged on antibiotics and steroids.  Final Clinical Impressions(s) / ED Diagnoses   Final diagnoses:  Bronchitis with bronchospasm    New Prescriptions New Prescriptions   AZITHROMYCIN (ZITHROMAX) 250 MG TABLET    Take 2 po the first day then once a day for the next 4 days.   PREDNISONE (DELTASONE) 20 MG TABLET    Take 3  po QD x 3d , then 2 po QD x 3d then 1 po QD x 3d  albuterol inhaler  Plan discharge  Devoria AlbeIva Evolette Pendell, MD, [facility]    Hadessah Grennan, MD 10/13/16 [id number]
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
## 2017-08-17 ENCOUNTER — Encounter: Payer: Self-pay | Admitting: *Deleted

## 2017-09-18 ENCOUNTER — Ambulatory Visit (INDEPENDENT_AMBULATORY_CARE_PROVIDER_SITE_OTHER): Payer: BLUE CROSS/BLUE SHIELD | Admitting: Family Medicine

## 2017-09-18 ENCOUNTER — Encounter: Payer: Self-pay | Admitting: Family Medicine

## 2017-09-18 VITALS — BP 128/62 | HR 80 | Temp 98.1°F | Resp 14 | Ht 67.0 in | Wt 142.0 lb

## 2017-09-18 DIAGNOSIS — Z7689 Persons encountering health services in other specified circumstances: Secondary | ICD-10-CM | POA: Diagnosis not present

## 2017-09-18 DIAGNOSIS — R809 Proteinuria, unspecified: Secondary | ICD-10-CM | POA: Diagnosis not present

## 2017-09-18 DIAGNOSIS — R739 Hyperglycemia, unspecified: Secondary | ICD-10-CM | POA: Diagnosis not present

## 2017-09-18 LAB — CBC WITH DIFFERENTIAL/PLATELET
BASOS PCT: 0.2 %
Basophils Absolute: 9 cells/uL (ref 0–200)
Eosinophils Absolute: 110 cells/uL (ref 15–500)
Eosinophils Relative: 2.5 %
HCT: 41.7 % (ref 38.5–50.0)
Hemoglobin: 14.1 g/dL (ref 13.2–17.1)
LYMPHS ABS: 1597 {cells}/uL (ref 850–3900)
MCH: 27.9 pg (ref 27.0–33.0)
MCHC: 33.8 g/dL (ref 32.0–36.0)
MCV: 82.4 fL (ref 80.0–100.0)
MPV: 10.4 fL (ref 7.5–12.5)
Monocytes Relative: 9.9 %
Neutro Abs: 2248 cells/uL (ref 1500–7800)
Neutrophils Relative %: 51.1 %
PLATELETS: 261 10*3/uL (ref 140–400)
RBC: 5.06 10*6/uL (ref 4.20–5.80)
RDW: 13.6 % (ref 11.0–15.0)
TOTAL LYMPHOCYTE: 36.3 %
WBC mixed population: 436 cells/uL (ref 200–950)
WBC: 4.4 10*3/uL (ref 3.8–10.8)

## 2017-09-18 LAB — URINALYSIS, ROUTINE W REFLEX MICROSCOPIC
BILIRUBIN URINE: NEGATIVE
Bacteria, UA: NONE SEEN /HPF
Glucose, UA: NEGATIVE
Hyaline Cast: NONE SEEN /LPF
Ketones, ur: NEGATIVE
LEUKOCYTES UA: NEGATIVE
NITRITE: NEGATIVE
PH: 6.5 (ref 5.0–8.0)
SPECIFIC GRAVITY, URINE: 1.015 (ref 1.001–1.03)
Squamous Epithelial / LPF: NONE SEEN /HPF (ref <=5)
WBC, UA: NONE SEEN /HPF (ref 0–5)

## 2017-09-18 LAB — MICROSCOPIC MESSAGE

## 2017-09-18 NOTE — Progress Notes (Signed)
Subjective:    Patient ID: Carlos Zimmerman, male    DOB: [birth year], 27 y.o.   MRN: [id number]  HPI  Patient is a very pleasant 27 year old African-American male here today to establish care.  His mother is my patient.  She has stage V chronic kidney disease hemodialysis dependent due to uncontrolled years of severe hypertension.  Recently, patient was being evaluated for a kidney donor program for his mother.  During the work-up, he was found to have a slightly elevated hemoglobin A1c of 5.9 as well as a urine sample showing protein and ketones.  This disqualified him from the donor program.  They recommended that he follow-up here today for further evaluation.  The patient denies any significant medical history.  He denies any hospitalizations.  He denies any surgeries.  He denies any significant medical illness.  He does admit to smoking cigars.  He smokes approximately 5 cigars/day.  He also admits to occasionally smoking marijuana.  He occasionally drinks alcohol but not to excess.  He does admit to eating a diet rich in carbohydrates.  He states that he eats pizza and similar foods almost every day.  He drinks a large quantity of milk.  He does not drink sodas or drink sugary beverages.  He is not getting any regular exercise.  Patient is not obese.  Patient works in [employer]. Past Medical History:  Diagnosis Date  . Allergy   . Medical history non-contributory    Past Surgical History:  Procedure Laterality Date  . NO PAST SURGERIES     No current outpatient medications on file prior to visit.   No current facility-administered medications on file prior to visit.    No Known Allergies Social History   Socioeconomic History  . Marital status: Single    Spouse name: Not on file  . Number of children: Not on file  . Years of education: Not on file  . Highest education level: Not on file  Occupational History  . Not on file  Social Needs  . Financial resource  strain: Not on file  . Food insecurity:    Worry: Not on file    Inability: Not on file  . Transportation needs:    Medical: Not on file    Non-medical: Not on file  Tobacco Use  . Smoking status: Current Some Day Smoker    Types: Cigars  . Smokeless tobacco: Never Used  Substance and Sexual Activity  . Alcohol use: No    Alcohol/week: 0.0 oz    Comment: Per pt 2-3-mths to get a beer  . Drug use: No    Types: Marijuana    Comment: Per pt he stop Marijuana 09-04-14.  Marland Kitchen Sexual activity: Not Currently  Lifestyle  . Physical activity:    Days per week: Not on file    Minutes per session: Not on file  . Stress: Not on file  Relationships  . Social connections:    Talks on phone: Not on file    Gets together: Not on file    Attends religious service: Not on file    Active member of club or organization: Not on file    Attends meetings of clubs or organizations: Not on file    Relationship status: Not on file  . Intimate partner violence:    Fear of current or ex partner: Not on file    Emotionally abused: Not on file    Physically abused: Not on file  Forced sexual activity: Not on file  Other Topics Concern  . Not on file  Social History Narrative  . Not on file   Family History  Problem Relation Age of Onset  . Kidney disease Mother   . Hypertension Mother   . Diabetes Mother   . Hypertension Maternal Aunt   . Hypertension Maternal Uncle   . Diabetes Maternal Grandmother   . Heart disease Maternal Grandmother      Review of Systems  All other systems reviewed and are negative.      Objective:   Physical Exam  Constitutional: He appears well-developed and well-nourished. No distress.  HENT:  Head: Normocephalic and atraumatic.  Nose: Nose normal.  Mouth/Throat: Oropharynx is clear and moist. No oropharyngeal exudate.  Eyes: Pupils are equal, round, and reactive to light. Conjunctivae are normal.  Neck: Normal range of motion. No JVD present. No tracheal  deviation present. No thyromegaly present.  Cardiovascular: Normal rate, regular rhythm and normal heart sounds. Exam reveals no gallop and no friction rub.  No murmur heard. Pulmonary/Chest: Effort normal and breath sounds normal. No stridor. No respiratory distress. He has no wheezes. He has no rales.  Abdominal: Soft. Bowel sounds are normal. He exhibits no distension and no mass. There is no tenderness. There is no guarding.  Musculoskeletal: He exhibits no edema.  Lymphadenopathy:    He has no cervical adenopathy.  Skin: He is not diaphoretic.  Vitals reviewed.         Assessment & Plan:  Proteinuria, unspecified type - Plan: Hemoglobin A1c, CBC with Differential/Platelet, COMPLETE METABOLIC PANEL WITH GFR, Urinalysis, Routine w reflex microscopic  Hyperglycemia - Plan: Hemoglobin A1c, CBC with Differential/Platelet, COMPLETE METABOLIC PANEL WITH GFR, Urinalysis, Routine w reflex microscopic  Encounter to establish care with new doctor  Begin by repeating urinalysis.  If the urinalysis shows persistent proteinuria, I would obtain a 24-hour total urine collection and measure protein and creatinine on this.  Greater than 300 mg per 24 hours would prompt nephrology consult and likely put the patient on an ACE-Inhibitor for prevention of further damage.    Repeat HgA1c.  Discussed low carbohydrate diet.  Recommended less than 60 g of carbohydrates per meal.  Recommended 30 minutes a day of aerobic exercise.  Recommended smoking cessation.

## 2017-09-19 LAB — COMPLETE METABOLIC PANEL WITH GFR
AG Ratio: 1.6 (calc) (ref 1.0–2.5)
ALT: 14 U/L (ref 9–46)
AST: 17 U/L (ref 10–40)
Albumin: 4.5 g/dL (ref 3.6–5.1)
Alkaline phosphatase (APISO): 61 U/L (ref 40–115)
BUN: 10 mg/dL (ref 7–25)
CALCIUM: 9.6 mg/dL (ref 8.6–10.3)
CO2: 28 mmol/L (ref 20–32)
Chloride: 104 mmol/L (ref 98–110)
Creat: 0.85 mg/dL (ref 0.60–1.35)
GFR, Est African American: 141 mL/min/{1.73_m2} (ref >=60)
GFR, Est Non African American: 122 mL/min/{1.73_m2} (ref >=60)
GLOBULIN: 2.9 g/dL (ref 1.9–3.7)
Glucose, Bld: 85 mg/dL (ref 65–99)
Potassium: 4.3 mmol/L (ref 3.5–5.3)
Sodium: 139 mmol/L (ref 135–146)
Total Bilirubin: 0.9 mg/dL (ref 0.2–1.2)
Total Protein: 7.4 g/dL (ref 6.1–8.1)

## 2017-09-19 LAB — HEMOGLOBIN A1C
HEMOGLOBIN A1C: 5.5 %{Hb} (ref <5.7)
Mean Plasma Glucose: 111 (calc)
eAG (mmol/L): 6.2 (calc)

## 2017-09-20 ENCOUNTER — Other Ambulatory Visit: Payer: Self-pay | Admitting: Family Medicine

## 2017-09-20 DIAGNOSIS — R809 Proteinuria, unspecified: Secondary | ICD-10-CM

## 2017-09-25 ENCOUNTER — Other Ambulatory Visit: Payer: BLUE CROSS/BLUE SHIELD

## 2017-09-25 DIAGNOSIS — R809 Proteinuria, unspecified: Secondary | ICD-10-CM

## 2017-09-26 LAB — PROTEIN,TOTAL W/CREATININE 24 HR UR
Creatinine, 24H Ur: 2.01 g/(24.h) (ref 0.50–2.15)
PROTEIN/CREATININE RATIO: 133 mg/g creat — ABNORMAL HIGH (ref <=114)
Protein, 24H Urine: 267 mg/24 h — ABNORMAL HIGH (ref 0–149)

## 2017-09-29 ENCOUNTER — Encounter: Payer: Self-pay | Admitting: Family Medicine

## 2017-09-29 DIAGNOSIS — R809 Proteinuria, unspecified: Secondary | ICD-10-CM | POA: Insufficient documentation

## 2017-10-02 ENCOUNTER — Other Ambulatory Visit: Payer: Self-pay | Admitting: Family Medicine

## 2017-10-02 DIAGNOSIS — R809 Proteinuria, unspecified: Secondary | ICD-10-CM

## 2017-11-22 ENCOUNTER — Other Ambulatory Visit: Payer: Self-pay | Admitting: Nephrology

## 2017-11-22 DIAGNOSIS — Z720 Tobacco use: Secondary | ICD-10-CM

## 2017-11-22 DIAGNOSIS — I129 Hypertensive chronic kidney disease with stage 1 through stage 4 chronic kidney disease, or unspecified chronic kidney disease: Secondary | ICD-10-CM

## 2017-11-22 DIAGNOSIS — R809 Proteinuria, unspecified: Secondary | ICD-10-CM

## 2017-11-29 ENCOUNTER — Ambulatory Visit
Admission: RE | Admit: 2017-11-29 | Discharge: 2017-11-29 | Disposition: A | Discharge status: Home / Self Care | Payer: BLUE CROSS/BLUE SHIELD | Source: Ambulatory Visit | Attending: Nephrology | Admitting: Nephrology

## 2017-11-29 DIAGNOSIS — I129 Hypertensive chronic kidney disease with stage 1 through stage 4 chronic kidney disease, or unspecified chronic kidney disease: Secondary | ICD-10-CM

## 2017-11-29 DIAGNOSIS — Z720 Tobacco use: Secondary | ICD-10-CM

## 2017-11-29 DIAGNOSIS — R809 Proteinuria, unspecified: Secondary | ICD-10-CM

## 2020-05-14 IMAGING — US US RENAL
1 series · 14 of 25 positions shown · non-contrast
Comparison: None.

CLINICAL DATA: Proteinuria.

EXAM:
RENAL / URINARY TRACT ULTRASOUND COMPLETE

[Series 1: us renal · 0.17mm/px · 14 of 41 slices shown]
[im 1/41]
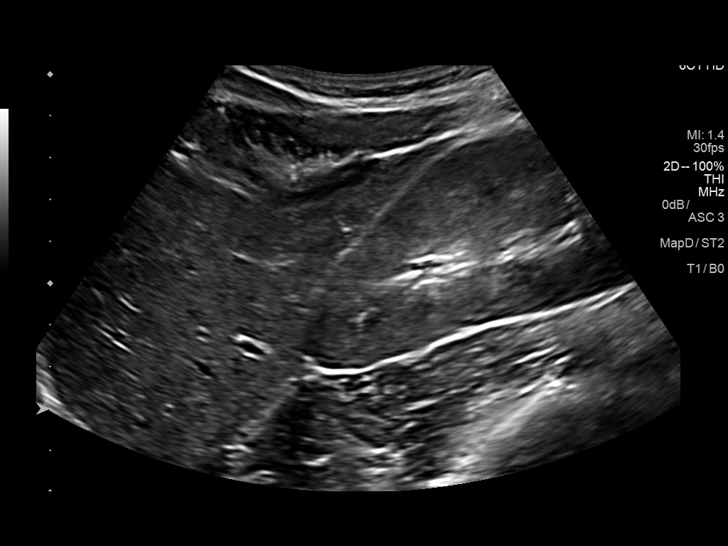
[im 4/41]
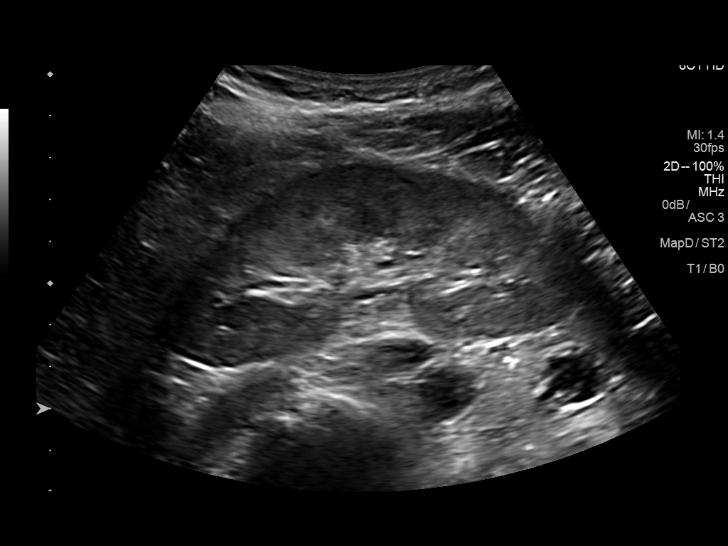
[im 7/41]
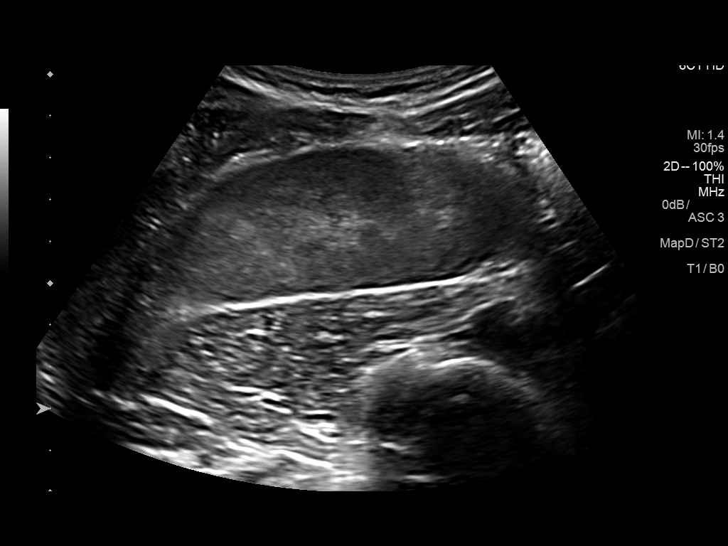
[im 11/41]
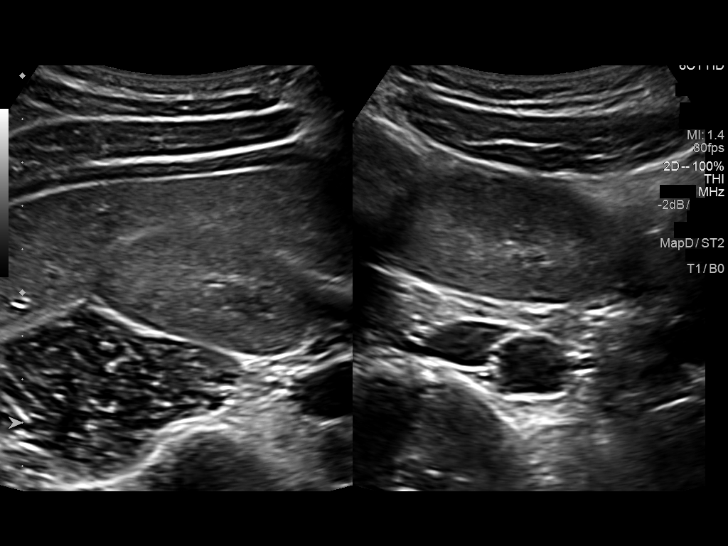
[im 14/41]
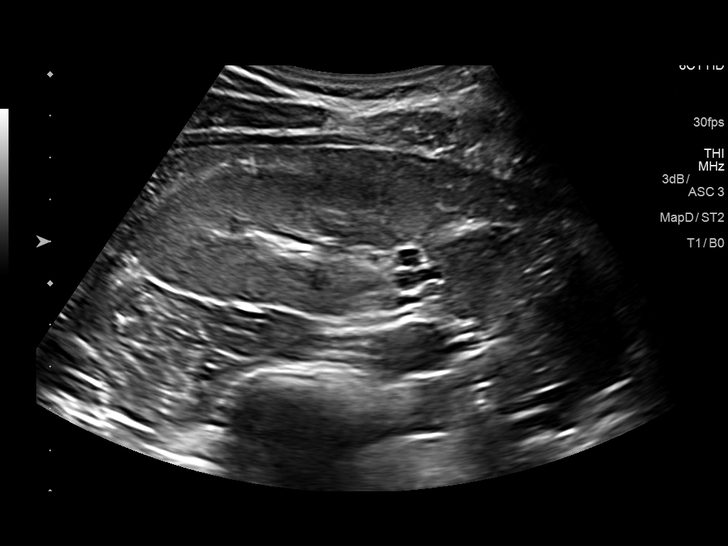
[im 16/41]
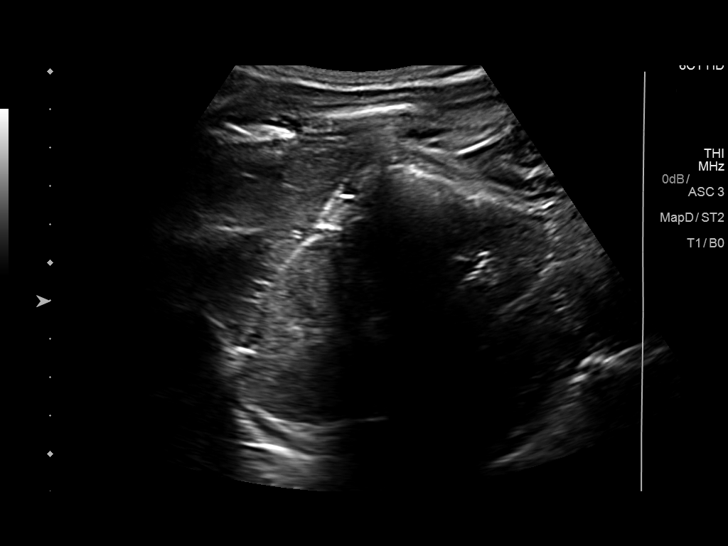
[im 19/41]
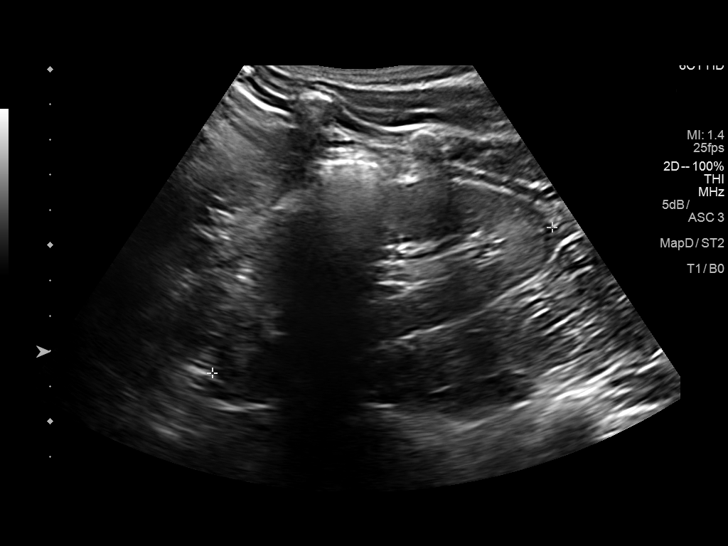
[im 22/41]
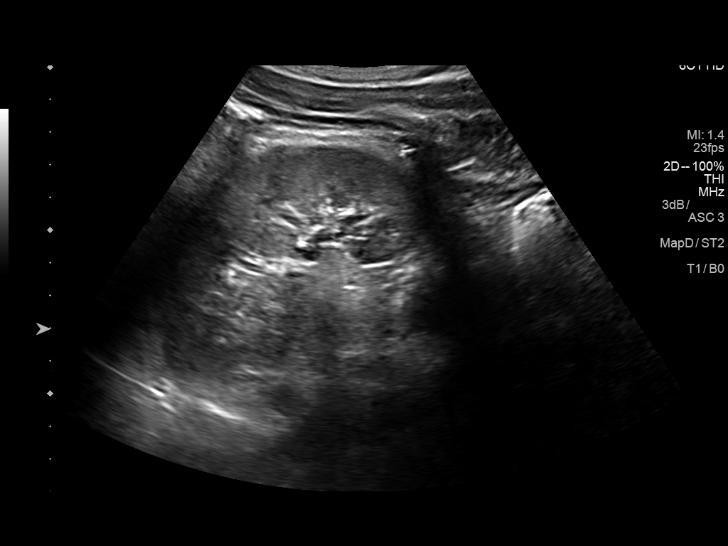
[im 26/41]
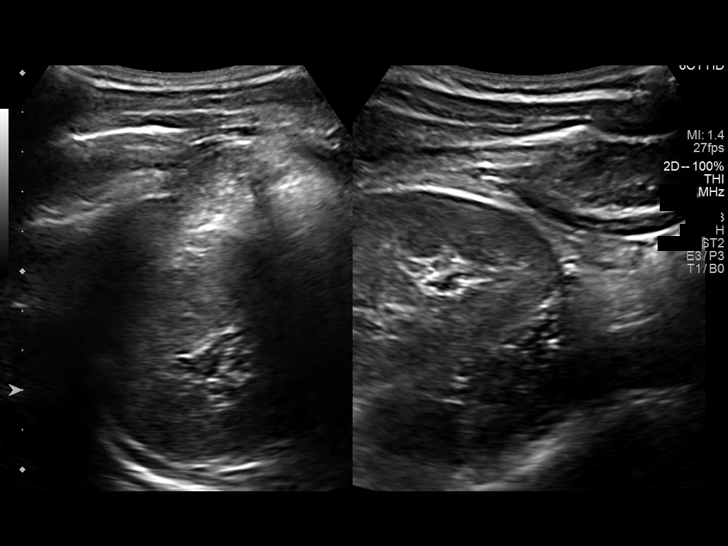
[im 27/41]
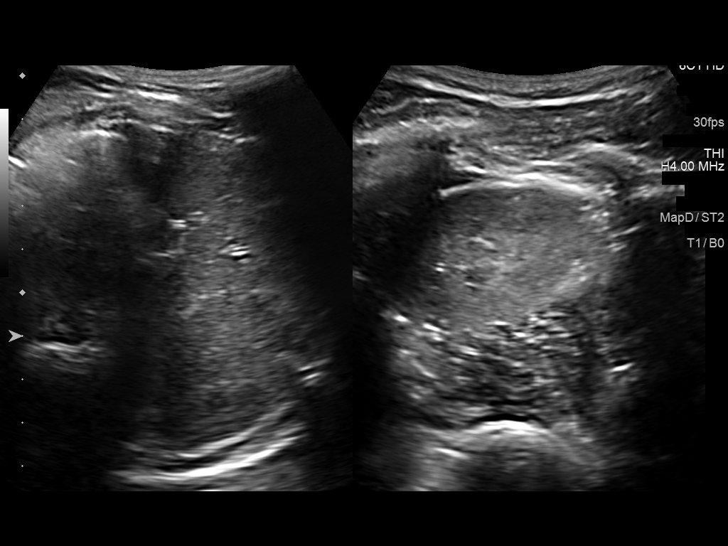
[im 31/41]
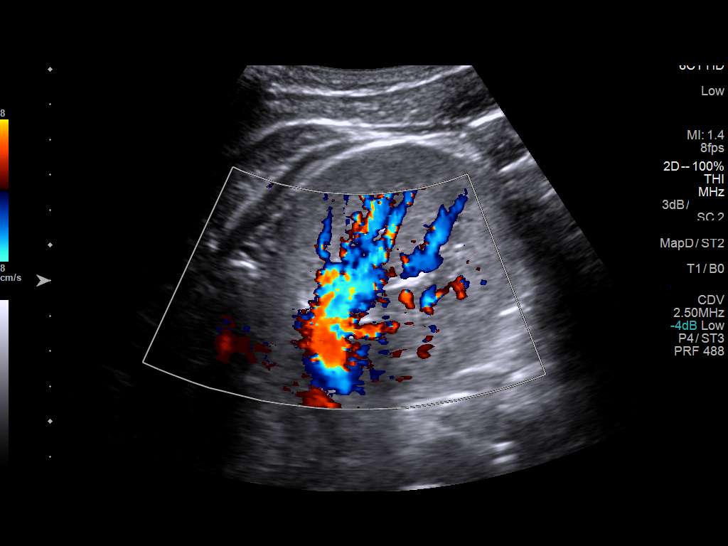
[im 34/41]
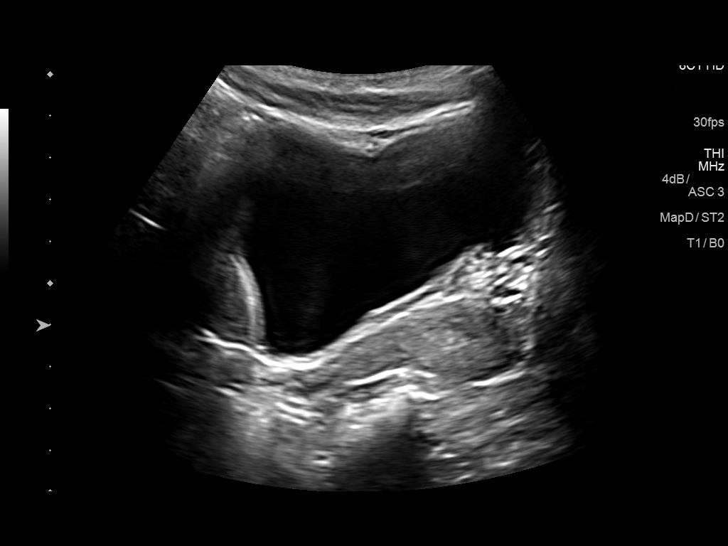
[im 37/41]
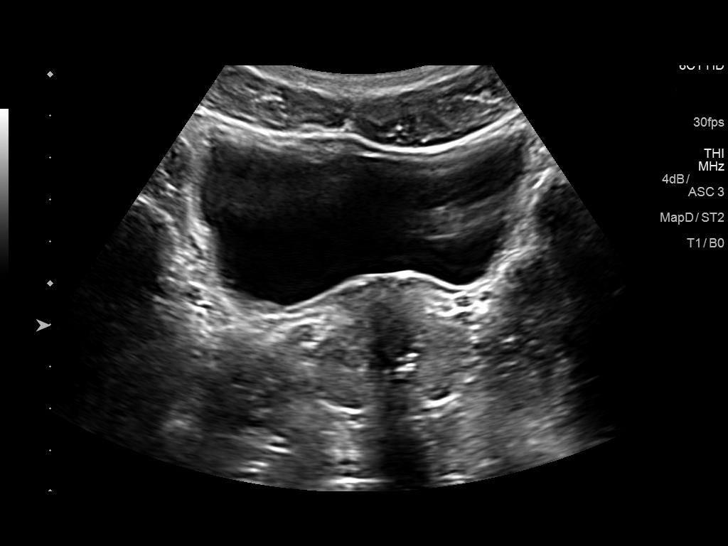
[im 41/41]
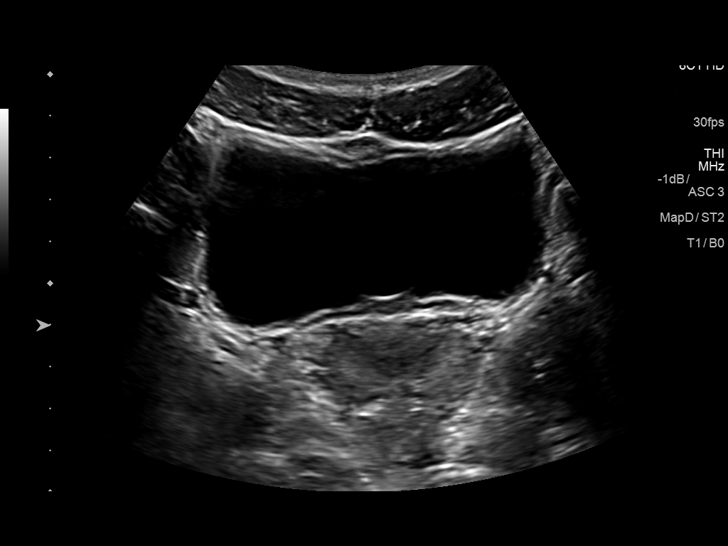

[14 of 25 positions shown; findings below may reference images not displayed]

FINDINGS: Right Kidney:

Length: 11.1 cm. Echogenicity within normal limits. No mass or
hydronephrosis visualized.

Left Kidney:

Length: 10.5 cm. Echogenicity within normal limits. No mass or
hydronephrosis visualized.

Bladder:

Appears normal for degree of bladder distention. Bilateral ureteral
jets are noted.
IMPRESSION: Normal renal ultrasound.

## 2021-09-11 ENCOUNTER — Encounter (HOSPITAL_COMMUNITY): Payer: Self-pay

## 2021-09-11 ENCOUNTER — Emergency Department (HOSPITAL_COMMUNITY)
Admission: EM | Admit: 2021-09-11 | Discharge: 2021-09-11 | Disposition: A | Discharge status: Home / Self Care | Payer: BLUE CROSS/BLUE SHIELD | Attending: Emergency Medicine | Admitting: Emergency Medicine

## 2021-09-11 ENCOUNTER — Other Ambulatory Visit: Payer: Self-pay

## 2021-09-11 DIAGNOSIS — R000 Tachycardia, unspecified: Secondary | ICD-10-CM | POA: Insufficient documentation

## 2021-09-11 DIAGNOSIS — R509 Fever, unspecified: Secondary | ICD-10-CM | POA: Diagnosis present

## 2021-09-11 DIAGNOSIS — J069 Acute upper respiratory infection, unspecified: Secondary | ICD-10-CM | POA: Diagnosis not present

## 2021-09-11 DIAGNOSIS — Z20822 Contact with and (suspected) exposure to covid-19: Secondary | ICD-10-CM | POA: Insufficient documentation

## 2021-09-11 DIAGNOSIS — M791 Myalgia, unspecified site: Secondary | ICD-10-CM | POA: Insufficient documentation

## 2021-09-11 LAB — RESP PANEL BY RT-PCR (FLU A&B, COVID) ARPGX2
Influenza A by PCR: NEGATIVE
Influenza B by PCR: NEGATIVE
SARS Coronavirus 2 by RT PCR: NEGATIVE

## 2021-09-11 LAB — GROUP A STREP BY PCR: Group A Strep by PCR: NOT DETECTED

## 2021-09-11 MED ORDER — CETIRIZINE HCL 10 MG PO TABS: 10.0000 mg | ORAL_TABLET | Freq: Every day | ORAL | 0 refills | Status: AC

## 2021-09-11 MED ORDER — FLUTICASONE PROPIONATE 50 MCG/ACT NA SUSP: 2.0000 | Freq: Every day | NASAL | 0 refills | Status: AC

## 2021-09-11 MED ORDER — BENZONATATE 100 MG PO CAPS: 100.0000 mg | ORAL_CAPSULE | Freq: Three times a day (TID) | ORAL | 0 refills | Status: AC | PRN

## 2021-09-11 NOTE — ED Provider Notes (Addendum)
?[facility] ?Provider Note ? ? ?CSN: [id number] ?Arrival date & time: 09/11/21  1904 ? ?  ? ?History ? ?Chief Complaint  ?Patient presents with  ? Fever  ? ? ?Carlos Zimmerman is a 31 y.o. male with chief complaint of fever, myalgias, sore throat, and cough started earlier today.  Tried managing fever with Tylenol at 1830, with highest reading of 101.6.  Denies any recent sick contacts or recent illness within the past month.  All symptoms started around the same time.  Has not taken any recent antibiotics.  Endorses mild neck tenderness and mild right-sided throat tenderness.  Denies drooling, difficulty swallowing, voice changes, ear pain, or chest pain.  Father confirms patient is at his baseline and does not appear altered or confused.  Cough is mildly productive.  Denies shortness of breath, chest pain, wheezing, or chest tightness.  Hx of seasonal allergies, not currently on any allergy medications. ? ?The history is provided by the patient and medical records.  ?Fever ?Associated symptoms: congestion, cough, myalgias and sore throat   ? ?  ? ?Home Medications ?Prior to Admission medications   ?Medication Sig Start Date End Date Taking? Authorizing Provider  ?benzonatate (TESSALON) 100 MG capsule Take 1 capsule (100 mg total) by mouth 3 (three) times daily as needed for up to 10 days for cough. 09/11/21 09/21/21 Yes Cecil Cobbs, PA-C  ?cetirizine (ZYRTEC ALLERGY) 10 MG tablet Take 1 tablet (10 mg total) by mouth daily for 14 days. 09/11/21 09/25/21 Yes Cecil Cobbs, PA-C  ?fluticasone (FLONASE) 50 MCG/ACT nasal spray Place 2 sprays into both nostrils daily. 09/11/21  Yes Cecil Cobbs, PA-C  ?   ? ?Allergies    ?Patient has no known allergies.   ? ?Review of Systems   ?Review of Systems  ?Constitutional:  Positive for fever.  ?HENT:  Positive for congestion and sore throat.   ?Respiratory:  Positive for cough.   ?Musculoskeletal:  Positive for myalgias.  ? ?Physical  Exam ?Updated Vital Signs ?BP 120/70   Pulse 99   Temp 98.9 ?F (37.2 ?C)   Resp 18   Ht 5\' 7"  (1.702 m)   Wt 63.5 kg   SpO2 100%   BMI 21.93 kg/m?  ?Physical Exam ?Vitals and nursing note reviewed.  ?Constitutional:   ?   General: He is not in acute distress. ?   Appearance: Normal appearance. He is well-developed. He is not ill-appearing or diaphoretic.  ?HENT:  ?   Head: Normocephalic and atraumatic.  ?   Right Ear: Ear canal and external ear normal. Tympanic membrane is bulging (Mild, clear fluid visualized behind TM).  ?   Left Ear: Ear canal and external ear normal. Tympanic membrane is bulging (Mild, clear fluid visualized behind TM).  ?   Nose: Congestion and rhinorrhea present. Rhinorrhea is clear.  ?   Right Turbinates: Enlarged.  ?   Left Turbinates: Enlarged.  ?   Mouth/Throat:  ?   Lips: Pink.  ?   Mouth: Mucous membranes are moist. No oral lesions or angioedema.  ?   Tongue: No lesions. Tongue does not deviate from midline.  ?   Palate: No mass.  ?   Pharynx: Oropharynx is clear. Uvula midline. Posterior oropharyngeal erythema present. No pharyngeal swelling, oropharyngeal exudate or uvula swelling.  ?   Tonsils: No tonsillar exudate or tonsillar abscesses.  ?   Comments: Erythema and petechia observed in the oropharynx ?Uvula appears normal and midline without significant  swelling ?Tonsils without exudate ?No evidence of tonsillar abscess or retropharyngeal abscess appreciated on exam ?Moist mucous membranes ?Eyes:  ?   General: No scleral icterus.    ?   Right eye: No discharge.     ?   Left eye: No discharge.  ?   Conjunctiva/sclera: Conjunctivae normal.  ?Cardiovascular:  ?   Rate and Rhythm: Regular rhythm. Tachycardia present.  ?   Pulses: Normal pulses.  ?   Heart sounds: Normal heart sounds. No murmur heard. ?   Comments: Heart rate varies between 90 to 100 bpm on exam ?Pulmonary:  ?   Effort: Pulmonary effort is normal. No respiratory distress.  ?   Breath sounds: Normal breath sounds.  No wheezing.  ?Chest:  ?   Chest wall: No tenderness.  ?Abdominal:  ?   General: Bowel sounds are normal.  ?   Palpations: Abdomen is soft.  ?   Tenderness: There is no abdominal tenderness.  ?Musculoskeletal:     ?   General: No swelling.  ?   Cervical back: Neck supple. No rigidity or tenderness.  ?   Right lower leg: No edema.  ?   Left lower leg: No edema.  ?Lymphadenopathy:  ?   Cervical: Cervical adenopathy (Bilaterally) present.  ?Skin: ?   General: Skin is warm and dry.  ?   Capillary Refill: Capillary refill takes less than 2 seconds.  ?Neurological:  ?   Mental Status: He is alert and oriented to person, place, and time.  ?   Motor: No weakness.  ?   Gait: Gait normal.  ?Psychiatric:     ?   Mood and Affect: Mood normal.  ? ? ?ED Results / Procedures / Treatments   ?Labs ?(all labs ordered are listed, but only abnormal results are displayed) ?Labs Reviewed  ?RESP PANEL BY RT-PCR (FLU A&B, COVID) [id number]  ?GROUP A STREP BY PCR  ? ? ?EKG ?None ? ?Radiology ?No results found. ? ?Procedures ?Procedures  ? ? ?Medications Ordered in ED ?Medications - No data to display ? ?ED Course/ Medical Decision Making/ A&P ?  ?                        ?Medical Decision Making ?Amount and/or Complexity of Data Reviewed ?External Data Reviewed: notes. ?Labs: ordered. Decision-making details documented in ED Course. ?Radiology:  Decision-making details documented in ED Course. ?ECG/medicine tests:  Decision-making details documented in ED Course. ? ?Risk ?OTC drugs. ?Prescription drug management. ? ? ?31 y.o. male presents to the ED for concern of Fever ? Marland Kitchen.  This involves an extensive number of treatment options, and is a complaint that carries with it a high risk of complications and morbidity.  The emergent differential diagnosis prior to evaluation includes, but is not limited to: Strep pharyngitis, viral pharyngitis, pneumonia, retropharyngeal abscess, tonsillar abscess, bronchitis ? ?This is not an exhaustive differential.   ? ?Past Medical History / Co-morbidities / Social History: ?Seasonal allergies ? ?Additional History:  ?Internal and external records from outside source obtained and reviewed including prior ED visits, family medicine ? ?Physical Exam: ?Physical exam performed. The pertinent findings include: No significant mass palpated of the throat.  Mild lymphadenopathy noted on the anterior cervical chain.  Initially febrile, but this improved upon reevaluation, and continued to do so throughout the visit.  Lungs CTAB without evidence of wheeze or rales on exam.  Speaking in full sentences, alert and oriented x3, and breathing without  difficulty.  Airway patent.  Mild bulging TMs with clear fluid visualized behind.  Did not appreciate any drooling, difficulty swallowing, orthopnea, dyspnea, torticollis, stridor, respiratory distress, or neck stiffness.  Uvula midline without significant swelling.  Oropharynx mildly erythematous with petechia, but without visible exudate.  No evidence of tonsillitis appreciated.  Mild neck tenderness or stiffness not appreciated on physical exam.  Negative Brudzinski sign.   ? ?Lab Tests: ?I ordered, and personally interpreted labs.  The pertinent results include:   ?Group A strep: Negative ?Flu/COVID: Negative ? ?Imaging Studies: ?I considered ordering imaging of the neck, but the patient did not present with drooling, dysphagia, dyspnea deviated uvula or tongue, or significant unilateral palpable mass.  Therefore I believe this imaging is appropriate to defer at this time. ? ?Medications: ?None ? ?ED Course/Disposition: ?Pt well-appearing on exam.  Negative for COVID, flu, strep.  No significant mass palpated of the throat.  Mild lymphadenopathy noted on the anterior cervical chain.  Patient arrived febrile and took Tylenol right before he arrived, and after a short time his temperature decreased to 98.9 and patient showed remarkable improvement.  Lungs CTAB without evidence of wheeze or  rales on exam.  Speaking in full sentences, alert and oriented x3, and breathing without difficulty.  Airway patent.  Not suspicious of current bronchitis or pneumonia.  Mild fluid present behind TMs bilaterally, sugges

## 2021-09-11 NOTE — Discharge Instructions (Addendum)
3 prescriptions have been sent to your pharmacy, which include: ?1.Tessalon-take 1 capsule every 8 hours for 1 week for cough relief ?2.Zyrtec-take 1 tablet daily for 1 week for congestion relief ?3.Flonase-Place 2 sprays into both nostrils daily for 10 days for congestion relief ? ?Try to utilize hot water with honey, as this is one of the best cough suppressants as discussed.  You may also utilize over-the-counter's's Cepacol lozenges for additional relief. ? ?Continue to focus on remaining hydrated and maintaining adequate nutritional intake. ? ?Follow-up with your primary care within the next 3 to 5 days for reevaluation and continued medical management ? ?Return to the ED for new or worsening symptoms as discussed ?

## 2021-09-11 NOTE — ED Triage Notes (Signed)
Pt presents with fever that started this morning. Pt temp 101.6. Denies being around anyone sick. Sore throat, cough, chills present. Pt took tylenol around 630 tonight. ?
